# Patient Record
Sex: Female | Born: 1975 | Race: Black or African American | Hispanic: No | Marital: Married | Smoking: Current some day smoker
Health system: Southern US, Community
[De-identification: ages and names within clinical notes are randomized; demographics above are authoritative.]

## PROBLEM LIST (undated history)

## (undated) DIAGNOSIS — R011 Cardiac murmur, unspecified: Secondary | ICD-10-CM

## (undated) DIAGNOSIS — E669 Obesity, unspecified: Secondary | ICD-10-CM

## (undated) DIAGNOSIS — E785 Hyperlipidemia, unspecified: Secondary | ICD-10-CM

## (undated) HISTORY — DX: Cardiac murmur, unspecified: R01.1

## (undated) HISTORY — DX: Hyperlipidemia, unspecified: E78.5

## (undated) HISTORY — DX: Obesity, unspecified: E66.9

## (undated) HISTORY — PX: OTHER SURGICAL HISTORY: SHX169

---

## 1998-02-27 HISTORY — PX: LAPAROSCOPY: SHX197

## 1999-02-04 ENCOUNTER — Ambulatory Visit (HOSPITAL_COMMUNITY): Admission: RE | Admit: 1999-02-04 | Discharge: 1999-02-04 | Payer: Self-pay | Admitting: Obstetrics & Gynecology

## 2000-08-29 ENCOUNTER — Other Ambulatory Visit: Admission: RE | Admit: 2000-08-29 | Discharge: 2000-08-29 | Payer: Self-pay | Admitting: *Deleted

## 2001-09-10 ENCOUNTER — Other Ambulatory Visit: Admission: RE | Admit: 2001-09-10 | Discharge: 2001-09-10 | Payer: Self-pay | Admitting: Obstetrics and Gynecology

## 2002-09-12 ENCOUNTER — Other Ambulatory Visit: Admission: RE | Admit: 2002-09-12 | Discharge: 2002-09-12 | Payer: Self-pay | Admitting: Obstetrics and Gynecology

## 2006-10-02 ENCOUNTER — Encounter: Admission: RE | Admit: 2006-10-02 | Discharge: 2006-10-02 | Payer: Self-pay | Admitting: *Deleted

## 2006-11-27 ENCOUNTER — Ambulatory Visit: Payer: Self-pay | Admitting: Internal Medicine

## 2006-11-28 ENCOUNTER — Ambulatory Visit: Payer: Self-pay | Admitting: Internal Medicine

## 2006-11-28 ENCOUNTER — Encounter: Payer: Self-pay | Admitting: Internal Medicine

## 2007-05-07 DIAGNOSIS — D126 Benign neoplasm of colon, unspecified: Secondary | ICD-10-CM

## 2007-05-07 DIAGNOSIS — E785 Hyperlipidemia, unspecified: Secondary | ICD-10-CM

## 2007-05-07 DIAGNOSIS — R1031 Right lower quadrant pain: Secondary | ICD-10-CM

## 2007-05-07 DIAGNOSIS — M549 Dorsalgia, unspecified: Secondary | ICD-10-CM | POA: Insufficient documentation

## 2007-05-07 DIAGNOSIS — R011 Cardiac murmur, unspecified: Secondary | ICD-10-CM

## 2007-05-07 DIAGNOSIS — J309 Allergic rhinitis, unspecified: Secondary | ICD-10-CM | POA: Insufficient documentation

## 2007-07-05 ENCOUNTER — Inpatient Hospital Stay (HOSPITAL_COMMUNITY): Admission: AD | Admit: 2007-07-05 | Discharge: 2007-07-05 | Payer: Self-pay | Admitting: Obstetrics and Gynecology

## 2007-11-19 ENCOUNTER — Inpatient Hospital Stay (HOSPITAL_COMMUNITY): Admission: AD | Admit: 2007-11-19 | Discharge: 2007-11-23 | Payer: Self-pay | Admitting: Obstetrics

## 2007-11-20 ENCOUNTER — Encounter (INDEPENDENT_AMBULATORY_CARE_PROVIDER_SITE_OTHER): Payer: Self-pay | Admitting: Obstetrics and Gynecology

## 2007-11-24 ENCOUNTER — Encounter: Admission: RE | Admit: 2007-11-24 | Discharge: 2007-12-23 | Payer: Self-pay | Admitting: Obstetrics

## 2009-11-15 ENCOUNTER — Emergency Department (HOSPITAL_COMMUNITY)
Admission: EM | Admit: 2009-11-15 | Discharge: 2009-11-15 | Payer: Self-pay | Source: Home / Self Care | Admitting: Emergency Medicine

## 2009-12-10 ENCOUNTER — Emergency Department (HOSPITAL_COMMUNITY): Admission: EM | Admit: 2009-12-10 | Discharge: 2009-12-10 | Payer: Self-pay | Admitting: Family Medicine

## 2010-04-11 ENCOUNTER — Other Ambulatory Visit: Payer: Self-pay | Admitting: Obstetrics and Gynecology

## 2010-04-22 ENCOUNTER — Encounter (INDEPENDENT_AMBULATORY_CARE_PROVIDER_SITE_OTHER): Payer: Self-pay | Admitting: *Deleted

## 2010-04-26 NOTE — Letter (Signed)
Summary: New Patient letter  [facility]  [address] [place], [place] [address]   Phone: [phone]  Fax: [phone]       04/22/2010 MRN: [id number]  Catherine Bates [address] [place], [place]  [address]  Dear Catherine Bates,  Welcome to the Gastroenterology Division at [facility].    You are scheduled to see Dr.  Juanda Chance on 05-30-10 at 9:15A.M. on the 3rd floor at [facility], [address]  We ask that you try to arrive at our office 15 minutes prior to your appointment time to allow for check-in.  We would like you to complete the enclosed self-administered evaluation form prior to your visit and bring it with you on the day of your appointment.  We will review it with you.  Also, please bring a complete list of all your medications or, if you prefer, bring the medication bottles and we will list them.  Please bring your insurance card so that we may make a copy of it.  If your insurance requires a referral to see a specialist, please bring your referral form from your primary care physician.  Co-payments are due at the time of your visit and may be paid by cash, check or credit card.     Your office visit will consist of a consult with your physician (includes a physical exam), any laboratory testing he/she may order, scheduling of any necessary diagnostic testing (e.g. x-ray, ultrasound, CT-scan), and scheduling of a procedure (e.g. Endoscopy, Colonoscopy) if required.  Please allow enough time on your schedule to allow for any/all of these possibilities.    If you cannot keep your appointment, please call [phone] to cancel or reschedule prior to your appointment date.  This allows [place] the opportunity to schedule an appointment for another patient in need of care.  If you do not cancel or reschedule by 5 p.m. the business day prior to your appointment date, you will be charged a $50.00 late cancellation/no-show fee.    Thank you for choosing [facility] for your medical needs.  We appreciate the opportunity to care for you.  Please visit [place] at our website  to learn more about our practice.                     Sincerely,                                                             The Gastroenterology Division

## 2010-05-30 ENCOUNTER — Other Ambulatory Visit: Payer: BC Managed Care – PPO

## 2010-05-30 ENCOUNTER — Other Ambulatory Visit: Payer: Self-pay | Admitting: Internal Medicine

## 2010-05-30 ENCOUNTER — Encounter: Payer: Self-pay | Admitting: Internal Medicine

## 2010-05-30 ENCOUNTER — Ambulatory Visit (INDEPENDENT_AMBULATORY_CARE_PROVIDER_SITE_OTHER): Payer: BC Managed Care – PPO | Admitting: Internal Medicine

## 2010-05-30 DIAGNOSIS — R1031 Right lower quadrant pain: Secondary | ICD-10-CM

## 2010-05-30 DIAGNOSIS — R1013 Epigastric pain: Secondary | ICD-10-CM

## 2010-05-30 DIAGNOSIS — R1032 Left lower quadrant pain: Secondary | ICD-10-CM | POA: Insufficient documentation

## 2010-05-30 MED ORDER — DICYCLOMINE HCL 10 MG PO CAPS: ORAL_CAPSULE | ORAL | Status: AC

## 2010-05-30 MED ORDER — DICYCLOMINE HCL 10 MG PO CAPS: ORAL_CAPSULE | ORAL | Status: DC

## 2010-05-30 NOTE — Patient Instructions (Signed)
You have been scheduled for an endoscopy on 06/06/10. Please follow written instructions given to you at your visit today. You have been scheduled for a CT scan. Please follow written instructions given to you at your visit today. Your physician has requested that you go to the basement for the following lab work before leaving today: Pregnancy test We have given you samples of Prilosec to take 20 mg once daily x 1 week. We have sent a prescription for Bentyl 10 mg for you to take 1 tablet by mouth twice daily. CC: Dr Selena Batten shelton, Dr Noland Fordyce

## 2010-05-30 NOTE — Progress Notes (Signed)
Catherine Bates 06/22/1975 MRN [id number]        History of Present Illness:  This is a 35 year old African American female with chronic right lower quadrant abdominal pain with exacerbation about 4 weeks ago. We saw her for the same problem 4 years ago in 2008 and she underwent a colonoscopy in October 2008 with findings of a small polyp which on pathology report showed only polypoid mucosa. I was able to examine her terminal ileum which was normal. Her mother has Crohn's disease. There was no evidence of Crohn's disease at that time. She has regular bowel habits with some loose stools. The episode several weeks ago lasted about a week and it was a rather severe abdominal pain localized only to right lower quadrant not radiating to her  leg or to her back. There was no fever. 12 years ago, patient underwent a laparoscopic exam of her pelvis with findings of an abnormal right fallopian tube. There was no evidence of endometriosis. The most recent attack including periumbilical and epigastric pain and was also associated with dyspepsia, heartburn and belching.   Past Medical History  Diagnosis Date  . Hyperlipidemia   . Cardiac murmur    Past Surgical History  Procedure Date  . Laparoscopy 2000    negative  . Tonsilletomy     reports that she has been smoking.  She has never used smokeless tobacco. She reports that she drinks alcohol. She reports that she does not use illicit drugs. family history includes Crohn's disease in her mother and Diabetes in her father, maternal grandfather, and paternal grandmother. No Known Allergies      Review of Systems: Negative for nausea vomiting and fever. Negative for chest pain or any musculoskeletal problems positive for menstrual pain allergies and heart murmur  The remainder of the 10  point ROS is negative except as outlined in H&P   Physical Exam: General appearance  Well developed in no distress, mildly overweight. Eyes- non  icteric. HEENT nontraumatic, normocephalic Mouth no lesions, tongue papillated, no cheilosis Neck supple without adenopathy, thyroid not enlarged, no carotid bruits, no JVD Lungs Clear to auscultation bilaterally Cor normal S1, normal S2, regular rhythm , no murmur,  quiet precordium Abdomen soft abdomen with normal active bowel sounds. No distention. Mild tenderness in the epigastrium and the right lower quadrant. No fullness or rebound, no CVA tenderness  Rectal: Moderate amount of formed Hemoccult-negative stool Extremities no pedal edema Skin no lesions Neurological alert and oriented x 3. Psychological normal mood and affect.  Assessment and Plan:  Problem #1-Chronic right lower quadrant abdominal pain with exacerbation extending into epigastrium and causing some upper GI symptoms as well. I suspect she may have pelvic adhesions although there was no abnormality on a CT scan in 2008. Her mother has Crohn's disease but her colonoscopy exam into the terminal ileum was completely normal. Irritable bowel syndrome is a possibility as well. Gastritis or possibly H. pylori gastropathy may also be a possibility. We will repeat the CT scan of the abdomen with attention to the right lower quadrant as well as to the upper abdomen. I have scheduled her for an upper endoscopy and H. pylori testing. We will start her on Bentyl10 mg twice a day on a trial basis and I have given her samples of Prilosec 20 mg daily.  Number #2 History of pelvic surgery in 2000, Patient is followed by Dr Ernestina Penna who requested GI consultation before consideration of a repeat laparoscopic exam.   05/30/2010  Catherine Bates

## 2010-05-31 ENCOUNTER — Telehealth: Payer: Self-pay | Admitting: *Deleted

## 2010-05-31 LAB — HCG, SERUM, QUALITATIVE: Preg, Serum: NEGATIVE

## 2010-05-31 NOTE — Telephone Encounter (Signed)
Message copied by Jesse Fall on Tue May 31, 2010  2:11 PM ------      Message from: [place], [place]      Created: Tue May 31, 2010  1:14 PM             Please call pt with normal results.,she is not pregnant

## 2010-05-31 NOTE — Telephone Encounter (Signed)
Left message for patient to call me at her home number.

## 2010-06-01 ENCOUNTER — Ambulatory Visit (INDEPENDENT_AMBULATORY_CARE_PROVIDER_SITE_OTHER)
Admission: RE | Admit: 2010-06-01 | Discharge: 2010-06-01 | Disposition: A | Discharge status: Home / Self Care | Payer: BC Managed Care – PPO | Source: Ambulatory Visit | Attending: Internal Medicine | Admitting: Internal Medicine

## 2010-06-01 ENCOUNTER — Other Ambulatory Visit: Payer: BC Managed Care – PPO

## 2010-06-01 ENCOUNTER — Telehealth: Payer: Self-pay | Admitting: *Deleted

## 2010-06-01 DIAGNOSIS — R1031 Right lower quadrant pain: Secondary | ICD-10-CM

## 2010-06-01 DIAGNOSIS — R1013 Epigastric pain: Secondary | ICD-10-CM

## 2010-06-01 MED ORDER — IOHEXOL 300 MG/ML  SOLN
100.0000 mL | Freq: Once | INTRAMUSCULAR | Status: AC | PRN
  Administered 2010-06-01: 100 mL via INTRAVENOUS

## 2010-06-01 NOTE — Telephone Encounter (Signed)
Left message for patient to call me back. 

## 2010-06-01 NOTE — Telephone Encounter (Signed)
Message copied by Jesse Fall on Wed Jun 01, 2010 10:24 AM ------      Message from: [name]      Created: Tue May 31, 2010  1:14 PM             Please call pt with normal results.,she is not pregnant

## 2010-06-02 ENCOUNTER — Telehealth: Payer: Self-pay | Admitting: *Deleted

## 2010-06-02 ENCOUNTER — Encounter: Payer: Self-pay | Admitting: Internal Medicine

## 2010-06-02 NOTE — Telephone Encounter (Signed)
Left a message for patient to call me back.

## 2010-06-02 NOTE — Telephone Encounter (Signed)
Message copied by Jesse Fall on Thu Jun 02, 2010  3:49 PM ------      Message from: [name]      Created: Wed Jun 01, 2010 10:34 PM       Please call pt with normal CT scan of the abdomen and pelvis. She has been scheduled for EGD.

## 2010-06-02 NOTE — Telephone Encounter (Signed)
Message copied by Jesse Fall on Thu Jun 02, 2010  8:31 AM ------      Message from: [name]      Created: Wed Jun 01, 2010 10:34 PM       Please call pt with normal CT scan of the abdomen and pelvis. She has been scheduled for EGD.

## 2010-06-02 NOTE — Telephone Encounter (Signed)
Left message for patient to call again. 

## 2010-06-06 ENCOUNTER — Encounter: Payer: Self-pay | Admitting: Internal Medicine

## 2010-06-06 ENCOUNTER — Ambulatory Visit (AMBULATORY_SURGERY_CENTER): Payer: BC Managed Care – PPO | Admitting: Internal Medicine

## 2010-06-06 VITALS — BP 119/73 | HR 65 | Temp 97.3°F | Resp 20

## 2010-06-06 DIAGNOSIS — R109 Unspecified abdominal pain: Secondary | ICD-10-CM

## 2010-06-06 DIAGNOSIS — R1013 Epigastric pain: Secondary | ICD-10-CM

## 2010-06-06 DIAGNOSIS — R1032 Left lower quadrant pain: Secondary | ICD-10-CM

## 2010-06-06 MED ORDER — SODIUM CHLORIDE 0.9 % IV SOLN: 500.0000 mL | INTRAVENOUS | Status: AC

## 2010-06-06 NOTE — Progress Notes (Signed)
Pt aware.

## 2010-06-06 NOTE — Progress Notes (Signed)
Vital signs stable throughout procedure.  Trend erased before printing.

## 2010-06-06 NOTE — Progress Notes (Signed)
Pt advised.

## 2010-06-07 ENCOUNTER — Telehealth: Payer: Self-pay | Admitting: *Deleted

## 2010-06-07 NOTE — Telephone Encounter (Signed)

## 2010-06-07 NOTE — Telephone Encounter (Signed)
Patient notified of lab results and CT scan results.

## 2010-06-07 NOTE — Telephone Encounter (Signed)
Patient given results

## 2010-06-07 NOTE — Telephone Encounter (Signed)
Results given to patient for lab and CT

## 2010-06-29 ENCOUNTER — Telehealth: Payer: Self-pay | Admitting: Internal Medicine

## 2010-06-29 NOTE — Telephone Encounter (Signed)
Pt had an EGD 05/30/2010- normal exam, no biopsies done because there was no gastritis to justify H.pylori tresting. . So although we have suggested H.Pylori  Testing prior to the exam, there was no indication for CLO test.

## 2010-06-29 NOTE — Telephone Encounter (Signed)
Received a call from [facility] with Dr. Elpidio Eric office. He received a note on patient from 05/30/10 visit that mentions getting H. Pylori testing done. It does not look like this was done and he wants to be sure it does not need to be done. Please, advise

## 2010-06-30 NOTE — Telephone Encounter (Signed)
Left a message for Baxter Hire to call me.

## 2010-06-30 NOTE — Telephone Encounter (Signed)
Spoke with Baxter Hire and gave her Dr. Regino Schultze answer.

## 2010-07-12 NOTE — Assessment & Plan Note (Signed)
[facility]                         GASTROENTEROLOGY OFFICE NOTE   Catherine Bates, Catherine Bates                       MRN:          [id number]  DATE:11/27/2006                            DOB:          04-Jan-1976    Catherine Bates is a very nice 35 year old patient of Dr.  Earlene Plater and Dr.  Seymour Bars who is here for evaluation of right lower quadrant abdominal  pain. The pain actually started in 2000 and she was evaluated by Dr.  Seymour Bars with laparoscopy. We do not have the operative report, but she  was thought to have enlarged fallopian tube on the right side.  Apparently, there was no endometriosis, but there was a question of  previous PID. The pain has waxed and waned over the last several years.  She moved away to [place] and she has moved back to [place] last  year. The pain has flared up again and is anterior in the right lower  quadrant. It is not related to eating. It sometimes hurts more when she  has a bowel movement especially if she strains. She is normally having  regular bowel habits with occasional diarrhea. At night, she cannot  sleep on her stomach. She sometimes will prop a pillow between her legs  to relieve the discomfort. The pain radiates to the right groin, but not  to the right leg or to the back. It never moves around. It is always in  the right lower quadrant. It is not associated with urination. It has  been getting worse over the period of last one year. There has been no  fever, rectal bleeding. The patient's mother is a patient with Crohn's  disease who had a terminal ileal resection in the past. Catherine Bates had a  pelvic ultrasound as well as CT scan of the abdomen on October 02, 2006  which showed normal terminal ileum with normal pelvic structures. No  pelvic adenopathy. Question of a urethral diverticulum, fluid inferior  to the cecal __________ of undetermined etiology. No definite diagnosis  of endometriosis.   MEDICATIONS:  Ibuprofen  four tablets p.r.n. pain in the right lower  quadrant.   PAST MEDICAL HISTORY:  1. Significant for laparoscopy 8 years ago.  2. Tonsillectomy.  3. Hyperlipidemia.   FAMILY HISTORY:  Positive for Crohn's disease in her mother and diabetes  in maternal grandfather, father and paternal grandmother.   SOCIAL HISTORY:  She is single. Graduate. Teaches English. Smokes 3-4  cigarettes a day and drinks alcohol only socially.   REVIEW OF SYSTEMS:  Positive for allergies, back pain, pain with  periods, heart murmur.   PHYSICAL EXAMINATION:  Blood pressure 120/72, pulse 68, weight 178  pounds. She was alert, oriented and in no distress.  Sclerae nonicteric. Oral cavity normal.  NECK: Supple without adenopathy.  LUNGS: Clear to auscultation.  COR: Normal S1, normal S2.  ABDOMEN: Soft, relaxed. Nondistended. No surgical scars. Tender in right  middle and right lower quadrant all the way to the right groin. The  inguinal area was tender. Straight leg raising was negative as well as  was sitting up and  lying down. Suprapubic area was unremarkable. I could  not reproduce her pain by moving her right leg.  RECTAL: Not done.  EXTREMITIES: No edema.   IMPRESSION:  9. A 35 year old [place] American female with chronic intermittent      right lower quadrant abdominal discomfort, tenderness and pain.      Previously evaluated with laparoscopic examination in 2000, by Dr.      Seymour Bars. We need to obtain the operative reports to determine the      exact findings of that examination. I assume she did not have      endometriosis or PID.  2. Positive family history of Crohn's discomfort in her mother. The      patient is at high risk for inflammatory bowel disease, although      her recent CT scan of the abdomen showed normal terminal ilium.      Rule out irritable bowel syndrome, rule out adhesions, rule out      internal hernia.   PLAN:  1. Colonoscopy scheduled. This to rule out possibility of  Crohn's      disease. Will try to intubate her terminal ilium and obtain      appropriate biopsies.  2. Obtain operative report from laparoscopic examination in 2000.  3. The fact that the pain is relieved by ibuprofen makes me think it      could be inflammatory pain. We may have to put her on a steady      course of NSAIDS depending on the results of the colonoscopy.      Eventually she may need a laparoscopic examination to look at the      right lower quadrant again if the pain continues.     [name], MD  Electronically Signed    DMB/MedQ  DD: 11/27/2006  DT: 11/27/2006  Job #: [id number]   cc:   Merlene Laughter. Renae Gloss, M.D.  Gerri Spore B. Earlene Plater, M.D.

## 2010-07-12 NOTE — Op Note (Signed)
[name], Catherine Bates                ACCOUNT NO.:  [id number]   MEDICAL RECORD NO.:  [id number]          PATIENT TYPE:  INP   LOCATION:  9115                          FACILITY:  [facility]   PHYSICIAN:  Maxie Better, M.D.DATE OF BIRTH:  10/12/1975   DATE OF PROCEDURE:  11/20/2007  DATE OF DISCHARGE:                               OPERATIVE REPORT   PREOPERATIVE DIAGNOSIS:  Nonreassuring fetal tracing, post dates.   POSTOPERATIVE DIAGNOSIS:  Nonreassuring fetal tracing, post dates.   PROCEDURES:  Primary cesarean section  Kerr hysterotomy.   ANESTHESIA:  Epidural.   SURGEON:  Maxie Better, MD.   ASSISTANT:  None.   DESCRIPTION OF PROCEDURE:  Under adequate epidural anesthesia, the  patient was placed in the supine position with a left lateral tilt and  indwelling Foley catheter was already in place.  She was sterilely  prepped and draped in the usual fashion.  A 0.25% Marcaine was injected  along the planned Pfannenstiel skin incision site.  A Pfannenstiel skin  incision was then made and carried down to the rectus fascia.  The  rectus fascia was opened transversely.  The rectus fascia was then  bluntly and sharply dissected off the rectus muscle in superior and  inferior fashion.  The rectus muscle was split in midline.  The parietal  peritoneum was entered sharply and extended.  The vesicouterine  peritoneum was opened transversely.  The bladder was then bluntly  dissected off lower uterine segment and displaced inferiorly with a  bladder retractor.  Curvilinear low-transverse uterine incision was then  made and extended with bandage scissors.  Copious amnioinfusion fluid  was noted.  Subsequent delivery of a live female from the left occiput  posterior presentation was accomplished.  Baby was bulb suctioned.  The  abdomen cord was clamped and cut.  The baby was transferred to the  awaiting pediatrician who assigned Apgars 8 and 9 at 1 and 5 minutes.  Placenta was manually  removed.  Uterine cavity was cleaned of debris.  Uterine incision had no extension.  It was closed in 2 layers, the first  layer with 0-Monocryl running locked stitch and second layer with  imbricating 0-Monocryl suture.  On the right angle, there was bleeding,  which resulted in several figure-of-eight sutures being placed with good  hemostasis subsequently noted.  Normal ovaries were noted bilaterally.  Normal right fallopian tube.  Left fallopian tube has a distal about 1.3-  cm paratubal cyst at the fimbriated end.  No ovarian cyst was noted  bilaterally.  The abdomen was copiously irrigated and suctioned of  debris.  The parietal peritoneum was closed with 2-0 Vicryl.  The rectus  fascia was closed with 0 Vicryl x2.  The subcutaneous area was  irrigated.  Interrupted 2-0 plain sutures were placed.  Skin  approximated with Ethicon staples.   Specimens - placenta sent to pathology.  Estimated blood loss was 600  mL.  Intraoperative fluid 2 liters.  Urine output 300 mL of urine that  was clearing.  The urine was bloody prior to transfer to the operating  room.  Fetal  weight of the baby was 6 pounds 11 ounces.  Complication  was none.  Sponge and instrument counts x2 was correct.  The patient  tolerated the procedure well and was transferred to recovery in stable  condition.      Maxie Better, M.D.  Electronically Signed     [place]/MEDQ  D:  11/20/2007  T:  11/20/2007  Job:  [id number]

## 2010-07-15 NOTE — Op Note (Signed)
[facility]  Patient:    Catherine Bates                        MRN: [id number] Proc. Date: 02/04/99 Adm. Date:  [id number] Attending:  Genia Del                           Operative Report  PREOPERATIVE DIAGNOSIS:       Right persistent pelvic pain.  POSTOPERATIVE DIAGNOSIS:      Right persistent pelvic pain.  OPERATION:                    Diagnostic laparoscopy with hydrotubation with methylene blue.  SURGEON:                      Genia Del, M.D.  ASSISTANT:  ANESTHESIA:                   General anesthesia.  ESTIMATED BLOOD LOSS:  DESCRIPTION OF PROCEDURE:     Under general anesthesia with endotracheal intubation the patient is in lithotomy position for diagnostic laparoscopy.  The patient is prepped with Betadine on the abdominal, suprapubic, vulvar, and vaginal areas.  Bladder catheterization is done and then a speculum is inserted and the uterus s cannulated.  Abdominally, an infraumbilical incision is done on 10 mm with the scalpel.  The Veress needle was introduced while raising the abdominal wall. Security tests are done and the pneumoperitoneum is created with 3 liters of CO2. The Veress needle is removed and insertion of the trocar with the laparoscope with the camera.  Inspection of the pelvic cavity to complete the inspection, a contralateral incision is done on 5 mm in the suprapubic area.  The 5 mm trocar is introduced and the probe.  The uterus is normal in appearance and volume.  The wo ovaries are normal in appearance and volume.  The right tube is completely normal with normal fimbria.  The left tube is normal except for a very slight dilatation at the distal part which is about 1.5 cm in diameter on about 1.5 cm in length.  The fimbria right after that look completely normal.  Both tubes are mobile and no inflammation is apparant.  Because of that small dilation, the decision is made to proceed to  methylene blue hydrotubation and this is positive on both sides with  very good flow on both sides confirming patency of the tubes bilaterally. Absolutely no lesion of endometriosis is seen and no adhesions.  The appendix is well seen and normal.  The hemostasis is good.  We removed the instruments, removed CO2.  The instruments vaginally are removed.  Infiltration of the subcutaneous tissue with Marcaine 0.25% is done and the skin is closed with Monocryl 4-0. The estimated blood loss was minimal.  No complications occurred.  The patient is transferred to the recovery room in good condition. DD:  02/04/99 TD:  02/06/99 Job: [id number]

## 2010-07-15 NOTE — Discharge Summary (Signed)
NAMEDEIRDRA, Bates                ACCOUNT NO.:  [id number]   MEDICAL RECORD NO.:  [id number]          PATIENT TYPE:  INP   LOCATION:  9115                          [facility]ACILITY:  [facility]   PHYSICIAN:  Maxie Better, M.D.DATE OF BIRTH:  [birth year]   DATE OF ADMISSION:  11/19/2007  DATE OF DISCHARGE:  11/23/2007                               DISCHARGE SUMMARY   ADMISSION DIAGNOSIS:  Post date or early labor.   DISCHARGE DIAGNOSIS:  Post dates delivered, nonreassuring fetal tracing.   PROCEDURE:  Primary cesarean section Sharl Ma hysterotomy   HISTORY OF PRESENT ILLNESS:  A 35 year old gravida 1, para 0 female at  21 and 6/7 weeks admitted in early labor.   HOSPITAL COURSE:  The patient was admitted by Dr. Ernestina Penna.  Foley  balloon was placed for further cervical dilatation.  Pitocin was  started.  Intrauterine pressure catheter was subsequently placed after  rupture of membranes.  The patient had elevation of blood pressure.  PIH  labs were normal except elevation of a uric acid.  Epidural was  subsequently placed for management of labor.  She had some variable  deceleration.  Amnioinfusion was started.  Pitocin was continued.  The  patient subsequently had deep variable decelerations, followed by late  deceleration.  Her cervical exam had been 800, +1 station.  Pitocin was  discontinued.  Decision was made to proceed with a primary cesarean  section.  Cesarean section was done.  Live female 6 pounds 11 ounces,  left occiput posterior presentation.  Normal tubes and ovaries, Apgars  were 8 and 9.  Postoperatively, the patient did not require any  magnesium sulfate.  Her blood pressures on post day #3 was 123-146/80-  90.  Her incision had no evidence of infection.  Postop day #1, her CBC  showed a hemoglobin of 10, hematocrit 29.3, white count 16.6, and  platelet count 183,000.  She was deemed well to be discharge, otherwise.   DISPOSITION:  Home.   CONDITION:  Stable.   DISCHARGE  MEDICATIONS:  1. Prenatal vitamins one p.o. daily.  2. Percocet 5/325, 1-2 tablets every 4-6 hours p.r.n. pain.  3. Motrin 800 mg every 6-8 hours p.r.n.   FOLLOW APPOINTMENT:  [facility] in one week for blood pressure  check and six week postpartum   DISCHARGE INSTRUCTIONS:  Per the postpartum booklet given and also for  PIH warning signs.      Maxie Better, M.D.  Electronically Signed     [place]/MEDQ  D:  01/07/2008  T:  01/08/2008  Job:  [id number]

## 2010-11-28 LAB — CBC
Hemoglobin: 12.2
Hemoglobin: 12.7
MCHC: 33.7
MCHC: 34
MCV: 93.8
MCV: 93.9
Platelets: 183
RBC: 3.89
RBC: 4.02
RDW: 15.5
WBC: 19.9 — ABNORMAL HIGH

## 2010-11-28 LAB — COMPREHENSIVE METABOLIC PANEL
ALT: 24
AST: 35
CO2: 19
Calcium: 9.1
Chloride: 101
GFR calc Af Amer: >60
GFR calc non Af Amer: >60
Glucose, Bld: 135 — ABNORMAL HIGH
Sodium: 132 — ABNORMAL LOW
Total Bilirubin: 0.9

## 2010-11-28 LAB — LACTATE DEHYDROGENASE: LDH: 206

## 2011-11-21 ENCOUNTER — Ambulatory Visit: Payer: BC Managed Care – PPO | Admitting: Internal Medicine

## 2011-12-05 ENCOUNTER — Encounter: Payer: Self-pay | Admitting: *Deleted

## 2011-12-05 ENCOUNTER — Encounter: Payer: BC Managed Care – PPO | Attending: Obstetrics | Admitting: *Deleted

## 2011-12-05 VITALS — Ht 61.5 in | Wt 191.2 lb

## 2011-12-05 DIAGNOSIS — E785 Hyperlipidemia, unspecified: Secondary | ICD-10-CM | POA: Insufficient documentation

## 2011-12-05 DIAGNOSIS — Z713 Dietary counseling and surveillance: Secondary | ICD-10-CM | POA: Insufficient documentation

## 2011-12-05 DIAGNOSIS — E669 Obesity, unspecified: Secondary | ICD-10-CM | POA: Insufficient documentation

## 2011-12-05 NOTE — Patient Instructions (Signed)
Goals:  Eat 3 meals/day, Avoid meal skipping   Increase protein rich foods  Follow "Plate Method" for portion control  Limit carbohydrate1-2 servings/meal   Choose more whole grains, lean protein, low-fat dairy, and fruits/non-starchy vegetables.   Aim for >10 min of physical activity daily  Limit sugar-sweetened beverages and concentrated sweets  Choose alternative to eating when emotions run high

## 2011-12-05 NOTE — Progress Notes (Signed)
  Medical Nutrition Therapy:  Appt start time: 1400 end time:  1500.   Assessment:  Primary concerns today: obesity.   MEDICATIONS: none   DIETARY INTAKE:  Usual eating pattern includes 2-3 meals and 0-2 snacks per day.  Everyday foods include trying more whole grains, some vegetables and proteins.  Avoided foods include none.    24-hr recall:  B ( AM): cookies; cheerios or oatmeal with 2% milk.  May have coffee with sugar and creamer Snk ( AM): none usually.  Maybe soda  L ( PM): salad most days from cafeteria Snk ( PM): cookie; Nabs; unsalted pretzels;  D ( PM): grilled chicken breast with onions and green beans Snk ( PM): none Beverages: water, coffee, soda  Usual physical activity: none  Estimated energy needs: 1500 calories 170 g carbohydrates 112 g protein 42 g fat  Progress Towards Goal(s):  In progress.   Nutritional Diagnosis:  [place]-3.3 Overweight/obesity As related to emotional eating, snacking on energy-dense foods, and limited physical activity.  As evidenced by BMI of 353.    Intervention:  Nutrition counseling provided.  Catherine Bates is here for weight management education.  She has tried Weight Watchers and weight loss drugs in the past.  She gained a good deal of weight during her pregnancy in 2009 and then started graduated school soon after.  She never lost her pregnancy weight due to late night "junk food" snacking during grad school.  She is not physically active now due to a busy schedule and a frustration that she doesn't loose weight fast enough while exercising.  She emotionally eats currently (cakes, cookies, etc) She admits to not even tasting the food when she eats out of emotions.  She feels she doesn't have the time to eat healthy.  Discussed healthy alternatives to dealing with emotions: calling friend or family member, enjoying an activity, rather than eating.  If she does eat, then take time to enjoy the food- taste it.  Discussed Intuitive Eating- eating  when hungry and not when not hungry.  Discussed food as fuel and there are no good foods or bad foods.  Some foods provide more energy than others and if we listen to our bodies we will fill up faster on those energy-dense foods and will eat smaller portions.  Discussed drinking more water and less soda.  Discussed MyPlate recommendations.  Encouraged moderate activity. Encouraged modest reduction in fat and cholesterol  Handouts given during visit include:  Reading food labels  Monitoring/Evaluation:  Dietary intake, exercise, and body weight in 1 month(s).

## 2012-01-11 ENCOUNTER — Ambulatory Visit: Payer: BC Managed Care – PPO | Admitting: *Deleted

## 2012-10-16 IMAGING — CT CT ABD-PELV W/ CM
2 of 5 series · 17 of 46 positions shown, 19 images · IV contrast (agent unspecified)
Comparison: None

CLINICAL DATA: Abdominal pain

CT ABDOMEN AND PELVIS WITH CONTRAST
TECHNIQUE: Multidetector CT imaging of the abdomen and pelvis was
performed following the standard protocol during bolus
administration of intravenous contrast.
Contrast: 100 ml of omni 300

[Series 2: abd/ pel 5mm · axial · 0.66mm/px · z∈[-416,-16]mm · 14 of 90 slices shown, 16 images]
[im 5/90  soft-tissue]
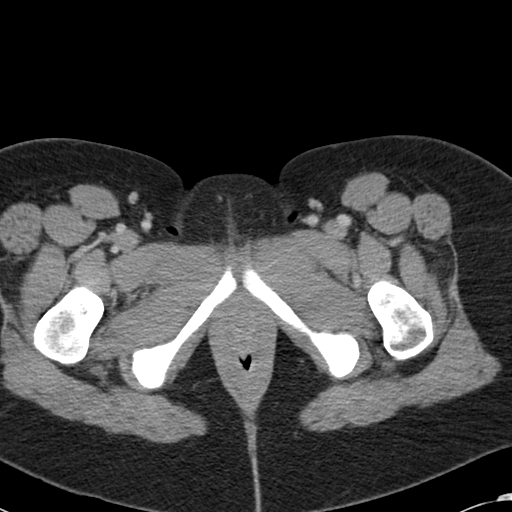
[im 5/90  bone]
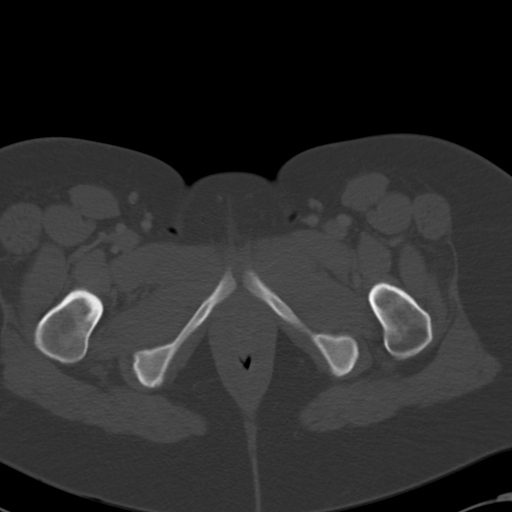
[im 10/90  soft-tissue]
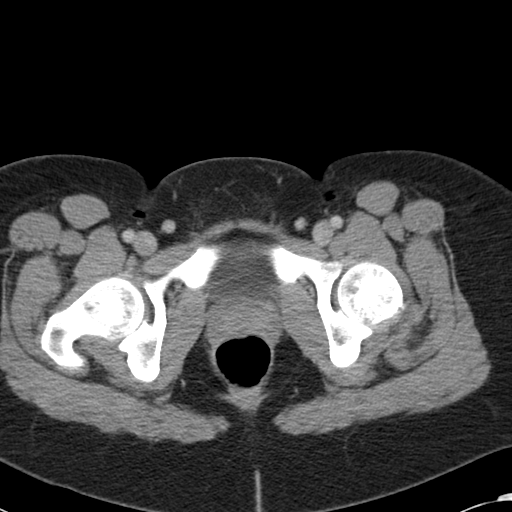
[im 20/90  soft-tissue]
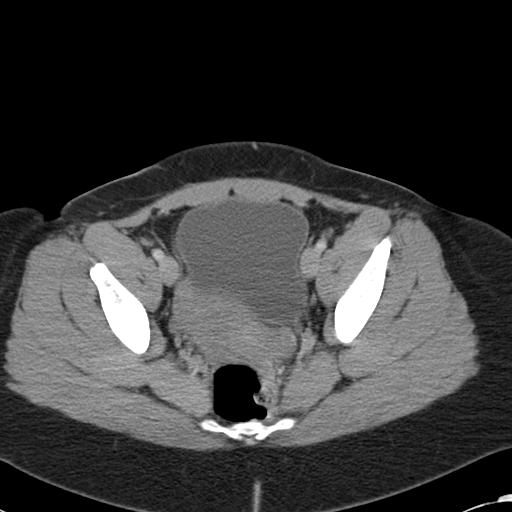
[im 25/90  soft-tissue]
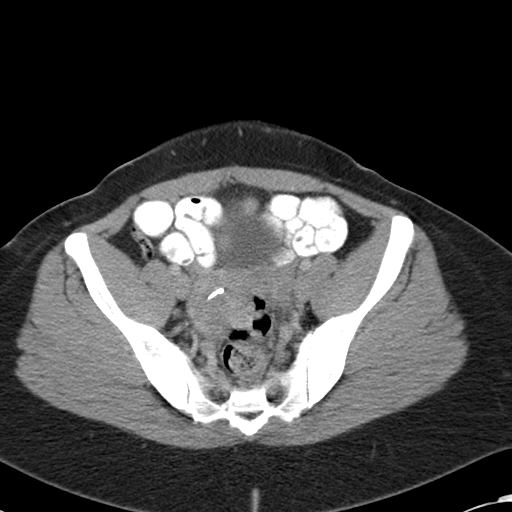
[im 30/90  soft-tissue]
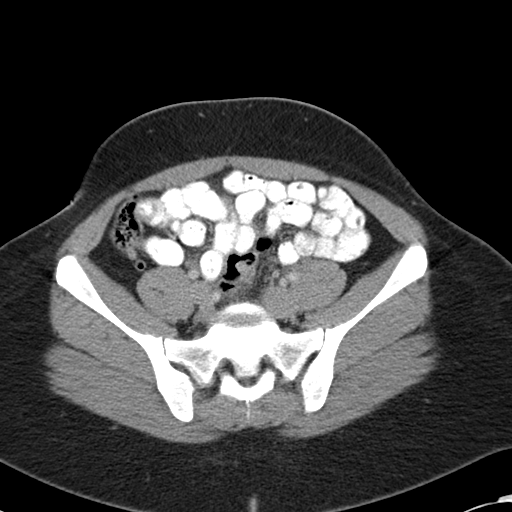
[im 35/90  soft-tissue]
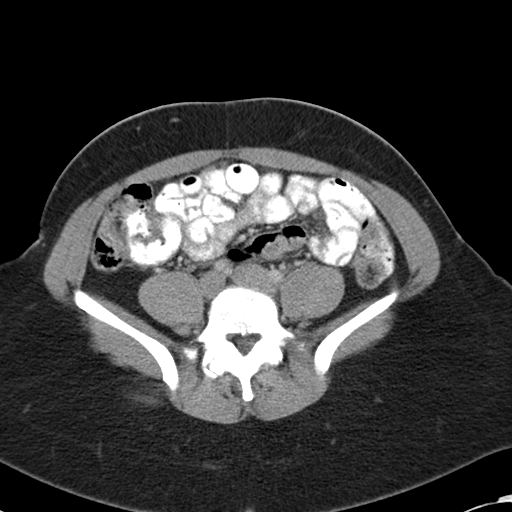
[im 40/90  soft-tissue]
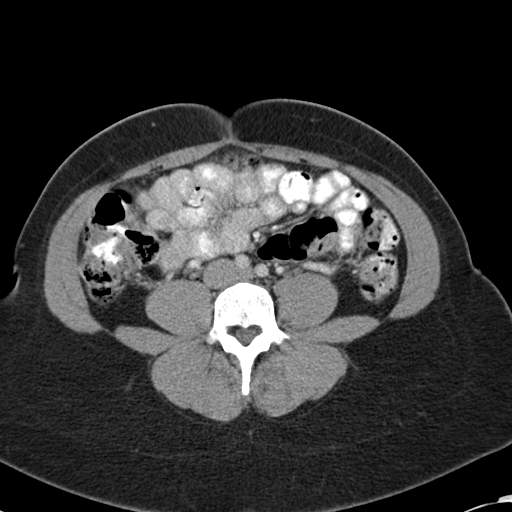
[im 50/90  soft-tissue]
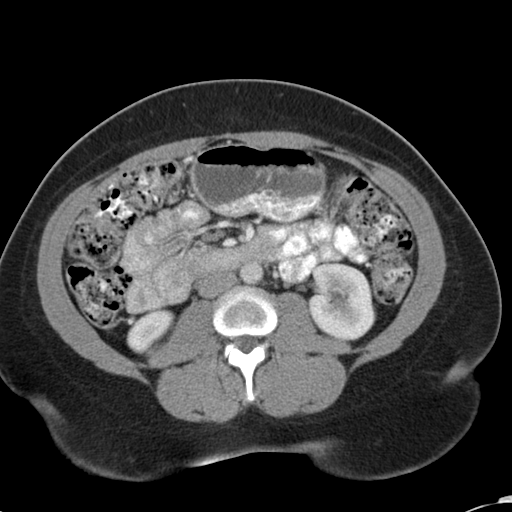
[im 55/90  soft-tissue]
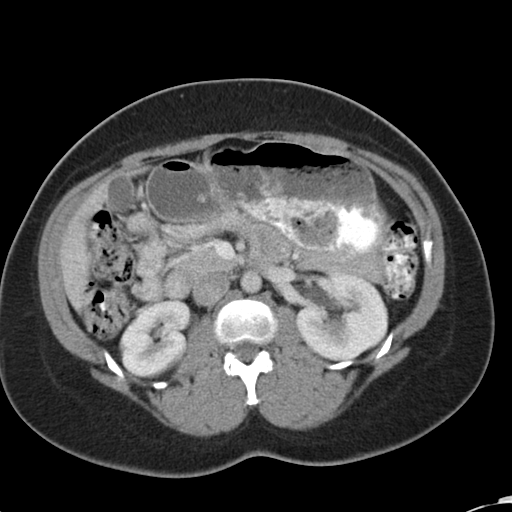
[im 55/90  bone]
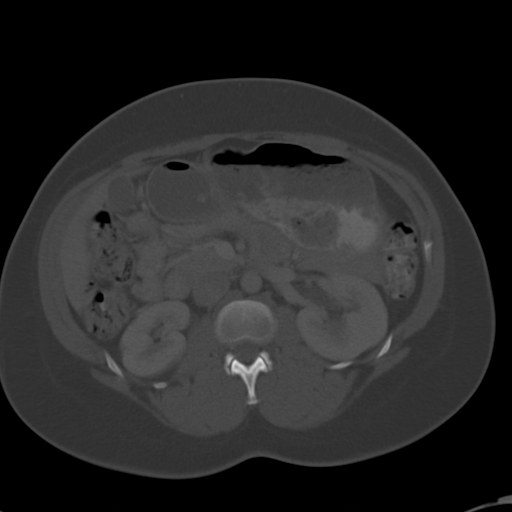
[im 60/90  soft-tissue]
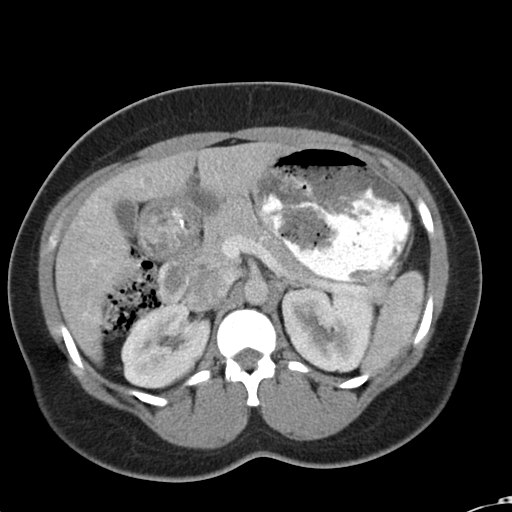
[im 65/90  soft-tissue]
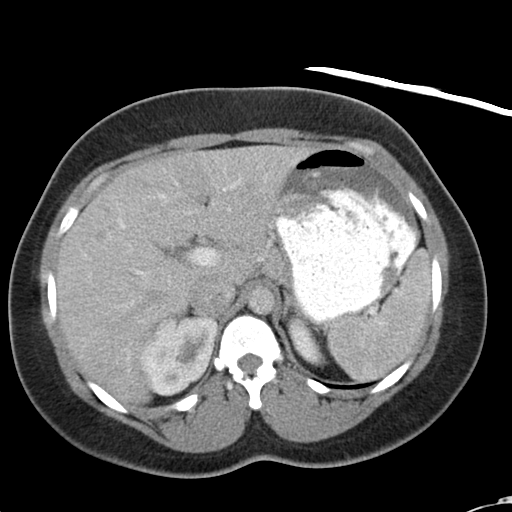
[im 70/90  soft-tissue]
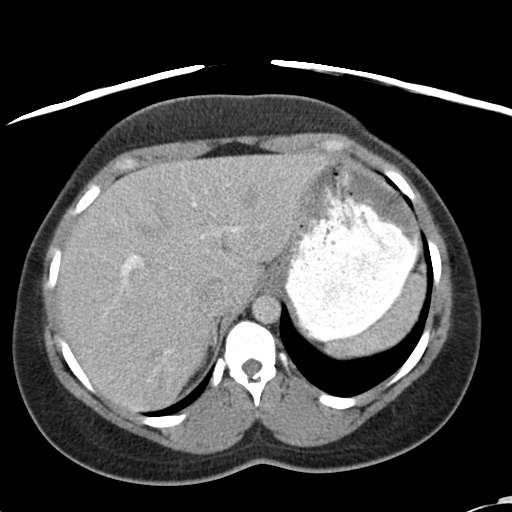
[im 80/90  soft-tissue]
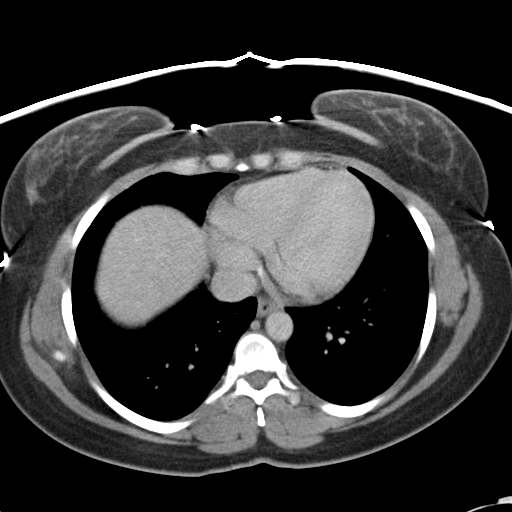
[im 85/90  soft-tissue]
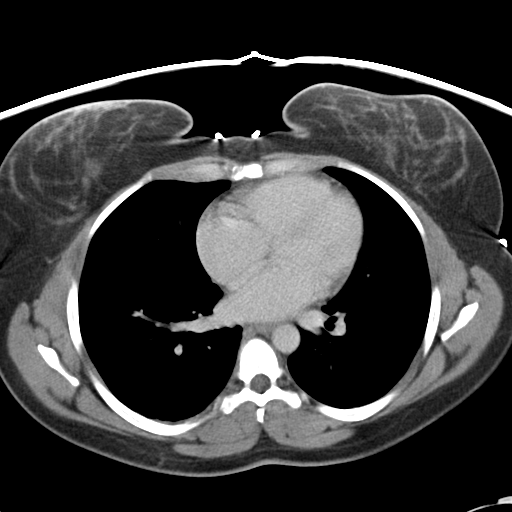

[Series 602: cor · coronal · 0.91mm/px · 3 of 107 slices shown]
[im 36/107  soft-tissue]
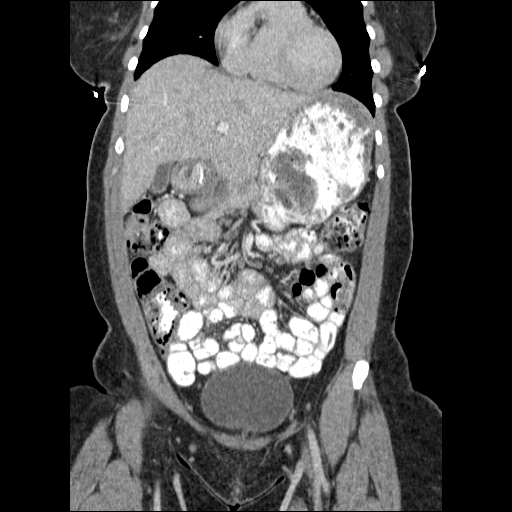
[im 48/107  soft-tissue]
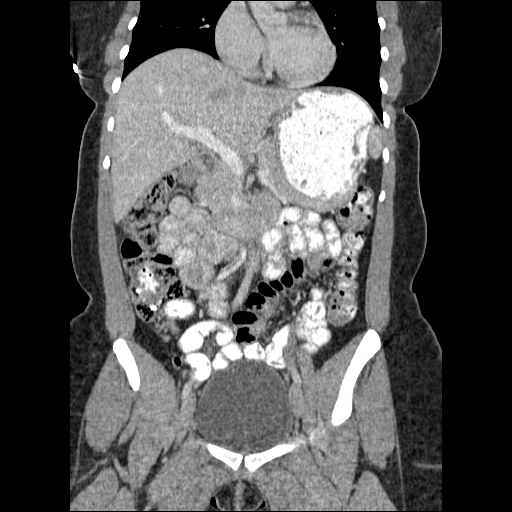
[im 59/107  soft-tissue]
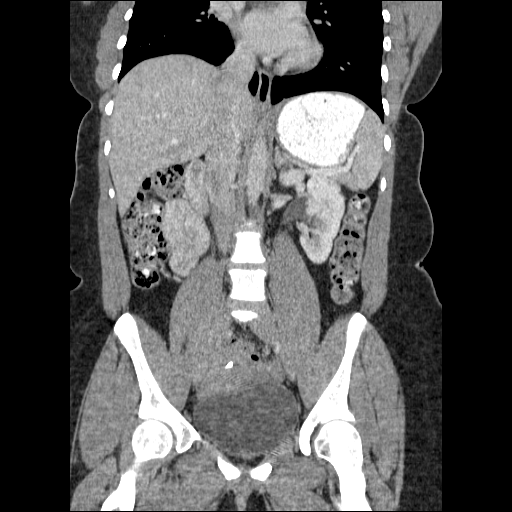

[17 of 46 positions shown; findings below may reference images not displayed]

FINDINGS: The lung bases are clear.

The spleen is normal.

Both adrenal glands are normal.

There is no focal liver abnormality.

Gallbladder appears normal.  No common bile duct dilatation.
The pancreas appears normal.

Normal appearance of the kidneys.

There are no enlarged upper abdominal lymph nodes.

No pelvic or inguinal adenopathy.

The stomach and the small bowel loops appear normal.

Appendix is negative.

Colon is normal.

The urinary bladder is normal.

There is an IUD within the uterine cavity.  Uterus and the adnexal
structures have an otherwise normal physiologic appearance

There is no free fluid within the abdomen or pelvis.

Review of the visualized osseous structures is unremarkable.
IMPRESSION: 1.  No acute findings within the abdomen or pelvis.  Normal exam.

## 2013-08-11 ENCOUNTER — Emergency Department (HOSPITAL_BASED_OUTPATIENT_CLINIC_OR_DEPARTMENT_OTHER)
Admission: EM | Admit: 2013-08-11 | Discharge: 2013-08-11 | Disposition: A | Discharge status: Home / Self Care | Payer: BC Managed Care – PPO | Attending: Emergency Medicine | Admitting: Emergency Medicine

## 2013-08-11 ENCOUNTER — Encounter (HOSPITAL_BASED_OUTPATIENT_CLINIC_OR_DEPARTMENT_OTHER): Payer: Self-pay | Admitting: Emergency Medicine

## 2013-08-11 DIAGNOSIS — E669 Obesity, unspecified: Secondary | ICD-10-CM | POA: Insufficient documentation

## 2013-08-11 DIAGNOSIS — Z79899 Other long term (current) drug therapy: Secondary | ICD-10-CM | POA: Insufficient documentation

## 2013-08-11 DIAGNOSIS — E785 Hyperlipidemia, unspecified: Secondary | ICD-10-CM | POA: Insufficient documentation

## 2013-08-11 DIAGNOSIS — F17200 Nicotine dependence, unspecified, uncomplicated: Secondary | ICD-10-CM | POA: Insufficient documentation

## 2013-08-11 DIAGNOSIS — R55 Syncope and collapse: Secondary | ICD-10-CM | POA: Insufficient documentation

## 2013-08-11 DIAGNOSIS — R011 Cardiac murmur, unspecified: Secondary | ICD-10-CM | POA: Insufficient documentation

## 2013-08-11 DIAGNOSIS — E860 Dehydration: Secondary | ICD-10-CM

## 2013-08-11 DIAGNOSIS — Z3202 Encounter for pregnancy test, result negative: Secondary | ICD-10-CM | POA: Insufficient documentation

## 2013-08-11 LAB — CBC
HEMATOCRIT: 38.8 % (ref 36.0–46.0)
HEMOGLOBIN: 13.2 g/dL (ref 12.0–15.0)
MCH: 31.3 pg (ref 26.0–34.0)
MCHC: 34 g/dL (ref 30.0–36.0)
MCV: 91.9 fL (ref 78.0–100.0)
Platelets: 231 10*3/uL (ref 150–400)
RBC: 4.22 MIL/uL (ref 3.87–5.11)
RDW: 13 % (ref 11.5–15.5)
WBC: 14.9 10*3/uL — ABNORMAL HIGH (ref 4.0–10.5)

## 2013-08-11 LAB — URINALYSIS, ROUTINE W REFLEX MICROSCOPIC
Bilirubin Urine: NEGATIVE
GLUCOSE, UA: NEGATIVE mg/dL
HGB URINE DIPSTICK: NEGATIVE
KETONES UR: NEGATIVE mg/dL
Nitrite: NEGATIVE
PROTEIN: NEGATIVE mg/dL
Specific Gravity, Urine: 1.009 (ref 1.005–1.030)
Urobilinogen, UA: 0.2 mg/dL (ref 0.0–1.0)
pH: 7 (ref 5.0–8.0)

## 2013-08-11 LAB — BASIC METABOLIC PANEL
BUN: 10 mg/dL (ref 6–23)
CALCIUM: 9.6 mg/dL (ref 8.4–10.5)
CO2: 27 mEq/L (ref 19–32)
Chloride: 102 mEq/L (ref 96–112)
Creatinine, Ser: 0.9 mg/dL (ref 0.50–1.10)
GFR calc Af Amer: >90 mL/min (ref >90)
GFR, EST NON AFRICAN AMERICAN: 81 mL/min — AB (ref >90)
GLUCOSE: 94 mg/dL (ref 70–99)
Potassium: 3.8 mEq/L (ref 3.7–5.3)
Sodium: 141 mEq/L (ref 137–147)

## 2013-08-11 LAB — PREGNANCY, URINE: Preg Test, Ur: NEGATIVE

## 2013-08-11 LAB — URINE MICROSCOPIC-ADD ON

## 2013-08-11 LAB — I-STAT CG4 LACTIC ACID, ED: Lactic Acid, Venous: 1.03 mmol/L (ref 0.5–2.2)

## 2013-08-11 MED ORDER — SODIUM CHLORIDE 0.9 % IV BOLUS (SEPSIS)
1000.0000 mL | Freq: Once | INTRAVENOUS | Status: AC
  Administered 2013-08-11: 1000 mL via INTRAVENOUS

## 2013-08-11 MED ORDER — SODIUM CHLORIDE 0.9 % IV BOLUS (SEPSIS)
2000.0000 mL | Freq: Once | INTRAVENOUS | Status: AC
  Administered 2013-08-11: 1000 mL via INTRAVENOUS

## 2013-08-11 NOTE — Discharge Instructions (Signed)
Dehydration, Adult Dehydration is when you lose more fluids from the body than you take in. Vital organs like the kidneys, brain, and heart cannot function without a proper amount of fluids and salt. Any loss of fluids from the body can cause dehydration.  CAUSES   Vomiting.  Diarrhea.  Excessive sweating.  Excessive urine output.  Fever. SYMPTOMS  Mild dehydration  Thirst.  Dry lips.  Slightly dry mouth. Moderate dehydration  Very dry mouth.  Sunken eyes.  Skin does not bounce back quickly when lightly pinched and released.  Dark urine and decreased urine production.  Decreased tear production.  Headache. Severe dehydration  Very dry mouth.  Extreme thirst.  Rapid, weak pulse (more than 100 beats per minute at rest).  Cold hands and feet.  Not able to sweat in spite of heat and temperature.  Rapid breathing.  Blue lips.  Confusion and lethargy.  Difficulty being awakened.  Minimal urine production.  No tears. DIAGNOSIS  Your caregiver will diagnose dehydration based on your symptoms and your exam. Blood and urine tests will help confirm the diagnosis. The diagnostic evaluation should also identify the cause of dehydration. TREATMENT  Treatment of mild or moderate dehydration can often be done at home by increasing the amount of fluids that you drink. It is best to drink small amounts of fluid more often. Drinking too much at one time can make vomiting worse. Refer to the home care instructions below. Severe dehydration needs to be treated at the hospital where you will probably be given intravenous (IV) fluids that contain water and electrolytes. HOME CARE INSTRUCTIONS   Ask your caregiver about specific rehydration instructions.  Drink enough fluids to keep your urine clear or pale yellow.  Drink small amounts frequently if you have nausea and vomiting.  Eat as you normally do.  Avoid:  Foods or drinks high in sugar.  Carbonated  drinks.  Juice.  Extremely hot or cold fluids.  Drinks with caffeine.  Fatty, greasy foods.  Alcohol.  Tobacco.  Overeating.  Gelatin desserts.  Wash your hands well to avoid spreading bacteria and viruses.  Only take over-the-counter or prescription medicines for pain, discomfort, or fever as directed by your caregiver.  Ask your caregiver if you should continue all prescribed and over-the-counter medicines.  Keep all follow-up appointments with your caregiver. SEEK MEDICAL CARE IF:  You have abdominal pain and it increases or stays in one area (localizes).  You have a rash, stiff neck, or severe headache.  You are irritable, sleepy, or difficult to awaken.  You are weak, dizzy, or extremely thirsty. SEEK IMMEDIATE MEDICAL CARE IF:   You are unable to keep fluids down or you get worse despite treatment.  You have frequent episodes of vomiting or diarrhea.  You have blood or green matter (bile) in your vomit.  You have blood in your stool or your stool looks black and tarry.  You have not urinated in 6 to 8 hours, or you have only urinated a small amount of very dark urine.  You have a fever.  You faint. MAKE SURE YOU:   Understand these instructions.  Will watch your condition.  Will get help right away if you are not doing well or get worse. Document Released: 02/13/2005 Document Revised: 05/08/2011 Document Reviewed: 10/03/2010 [employer] Patient Information 2014 [employer].  

## 2013-08-11 NOTE — ED Notes (Signed)
Pt c/o witnessed syncopal episode yesterday while at [place], c/o generalized weakness

## 2013-08-11 NOTE — ED Provider Notes (Signed)
CSN: [id number]     Arrival date & time 08/11/13  1321 History   First MD Initiated Contact with Patient 08/11/13 1336     Chief Complaint  Patient presents with  . Near Syncope     (Consider location/radiation/quality/duration/timing/severity/associated sxs/prior Treatment) Patient is a 38 y.o. female presenting with near-syncope and syncope. The history is provided by the patient.  Near Syncope Pertinent negatives include no chest pain, no abdominal pain and no shortness of breath.  Loss of Consciousness Episode history:  Single Most recent episode:  Yesterday Timing:  Constant Progression:  Resolved Chronicity:  Recurrent (happened twice before when in the heat) Context: not blood draw, not bowel movement and not dehydration   Witnessed: yes   Relieved by:  Nothing Worsened by:  Nothing tried Associated symptoms: weakness   Associated symptoms: no chest pain, no fever, no nausea, no seizures, no shortness of breath and no vomiting     Past Medical History  Diagnosis Date  . Hyperlipidemia   . Cardiac murmur   . Obesity    Past Surgical History  Procedure Laterality Date  . Laparoscopy  2000    negative  . Tonsilletomy    . Cesarean section     Family History  Problem Relation Age of Onset  . Crohn's disease Mother   . Diabetes Father   . Diabetes Maternal Grandfather   . Diabetes Paternal Grandmother   . Cancer Other   . Hyperlipidemia Other   . Hypertension Other   . Obesity Other    History  Substance Use Topics  . Smoking status: Current Some Day Smoker -- 0.50 packs/day    Types: Cigarettes  . Smokeless tobacco: Never Used  . Alcohol Use: Yes     Comment: socially   OB History   Grav Para Term Preterm Abortions TAB SAB Ect Mult Living                 Review of Systems  Constitutional: Positive for fatigue. Negative for fever and chills.  Respiratory: Negative for cough and shortness of breath.   Cardiovascular: Positive for syncope and  near-syncope. Negative for chest pain and leg swelling.  Gastrointestinal: Negative for nausea, vomiting and abdominal pain.  Neurological: Positive for weakness. Negative for seizures.  All other systems reviewed and are negative.     Allergies  Review of patient's allergies indicates no known allergies.  Home Medications   Prior to Admission medications   Medication Sig Start Date End Date Taking? Authorizing Provider  dicyclomine (BENTYL) 10 MG capsule Take 1 tablet by mouth twice daily 05/30/10   Lafayette Dragon, MD  levonorgestrel (MIRENA) 20 MCG/24HR IUD 1 each by Intrauterine route once.      Historical Provider, MD   BP 139/77  Pulse 93  Temp(Src) 97.7 F (36.5 C) (Oral)  Resp 20  Ht 5\' 2"  (1.575 m)  Wt 190 lb (86.183 kg)  BMI 34.74 kg/m2  SpO2 100% Physical Exam  Nursing note and vitals reviewed. Constitutional: She is oriented to person, place, and time. She appears well-developed and well-nourished. No distress.  HENT:  Head: Normocephalic and atraumatic.  Mouth/Throat: Oropharynx is clear and moist.  Eyes: EOM are normal. Pupils are equal, round, and reactive to light.  Neck: Normal range of motion. Neck supple.  Cardiovascular: Normal rate and regular rhythm.  Exam reveals no friction rub.   No murmur heard. Pulmonary/Chest: Effort normal and breath sounds normal. No respiratory distress. She has no wheezes. She  has no rales.  Abdominal: Soft. She exhibits no distension. There is no tenderness. There is no rebound.  Musculoskeletal: Normal range of motion. She exhibits no edema.  Neurological: She is alert and oriented to person, place, and time. She exhibits normal muscle tone.  Skin: Skin is warm. No rash noted. She is not diaphoretic.    ED Course  Procedures (including critical care time) Labs Review Labs Reviewed  CBC - Abnormal; Notable for the following:    WBC 14.9 (*)    All other components within normal limits  BASIC METABOLIC PANEL - Abnormal;  Notable for the following:    GFR calc non Af Amer 81 (*)    All other components within normal limits  URINALYSIS, ROUTINE W REFLEX MICROSCOPIC - Abnormal; Notable for the following:    Leukocytes, UA SMALL (*)    All other components within normal limits  URINE MICROSCOPIC-ADD ON  PREGNANCY, URINE  I-STAT CG4 LACTIC ACID, ED    Imaging Review No results found.   EKG Interpretation   Date/Time:  Monday August 11 2013 14:54:59 EDT Ventricular Rate:  75 PR Interval:  180 QRS Duration: 84 QT Interval:  376 QTC Calculation: 419 R Axis:   80 Text Interpretation:  Normal sinus rhythm Normal ECG No prior Confirmed by  Mingo Amber  MD, Carmichael Burdette ([id number]) on 08/11/2013 3:02:53 PM      MDM   Final diagnoses:  Dehydration  Syncope    38F presents with weakness. Had syncopal event yesterday - was at the [place] all day, was inside, felt hot, passed out when she walked outside. Hx of syncopal events in the past with similar circumstances. No preceding CP, SOB. Today felt weak diffusely. No fevers. Here exam benign. Will check labs, give fluids. EKG non-concerning. Labs ok, no urinary symptoms so trace leukocytes on urine with 0-2 WBCs likely contaminate. Patient feeling a little better after fluids, stable for discharge.  Osvaldo Shipper, MD 08/12/13 [phone]

## 2013-10-18 ENCOUNTER — Encounter: Payer: Self-pay | Admitting: Internal Medicine
